# Patient Record
Sex: Female | Born: 1980 | Race: White | Hispanic: No | Marital: Married | Smoking: Never smoker
Health system: Southern US, Community
[De-identification: ages and names within clinical notes are randomized; demographics above are authoritative.]

## PROBLEM LIST (undated history)

## (undated) DIAGNOSIS — T7840XA Allergy, unspecified, initial encounter: Secondary | ICD-10-CM

## (undated) HISTORY — DX: Allergy, unspecified, initial encounter: T78.40XA

---

## 2009-07-19 ENCOUNTER — Encounter (INDEPENDENT_AMBULATORY_CARE_PROVIDER_SITE_OTHER): Payer: Self-pay | Admitting: Obstetrics and Gynecology

## 2009-07-19 ENCOUNTER — Inpatient Hospital Stay (HOSPITAL_COMMUNITY): Admission: RE | Admit: 2009-07-19 | Discharge: 2009-07-22 | Payer: Self-pay | Admitting: Obstetrics and Gynecology

## 2011-03-01 LAB — CBC
HCT: 37.4 % (ref 36.0–46.0)
Hemoglobin: 10.7 g/dL — ABNORMAL LOW (ref 12.0–15.0)
Hemoglobin: 12.7 g/dL (ref 12.0–15.0)
MCHC: 34.2 g/dL (ref 30.0–36.0)
MCV: 93.6 fL (ref 78.0–100.0)
RBC: 3.34 MIL/uL — ABNORMAL LOW (ref 3.87–5.11)
WBC: 11.4 10*3/uL — ABNORMAL HIGH (ref 4.0–10.5)

## 2011-04-08 NOTE — Op Note (Signed)
NAME:  Alexandra Ochoa, PINCOCK NO.:  [id number]   MEDICAL RECORD NO.:  [id number]          PATIENT TYPE:  INP   LOCATION:  9105                          FACILITY:  [facility]   PHYSICIAN:  Maxie Better, M.D.DATE OF BIRTH:  1981/03/29   DATE OF PROCEDURE:  07/19/2009  DATE OF DISCHARGE:                               OPERATIVE REPORT   PREOPERATIVE DIAGNOSES:  1. Term gestation.  2. Breech presentation.  3. Small for gestational age baby.   POSTOPERATIVE DIAGNOSES:  1. Incomplete breech presentation.  2. Term gestation.   PROCEDURE:  Primary cesarean section  Kerr hysterotomy.   ANESTHESIA:  Spinal.   SURGEON:  Maxie Better, MD   ASSISTANT:  Marlinda Mike, CNM   INDICATION:  A 30 year old gravida 1, para 0 female at term with a small  for gestational age baby with a breech presentation admitted for primary  cesarean section.  Surgical risk was reviewed with the patient and her  husband.  Consent was signed.  The patient was transferred to the  operating room after reconfirming breech presentation.   PROCEDURE:  Under adequate spinal anesthesia, the patient was placed in  a supine position with a left lateral tilt.  She was sterilely prepped  and draped in usual fashion.  Indwelling Foley catheter was sterilely  placed.  A 0.25% Marcaine was injected along the planned Pfannenstiel  skin incision site.  Pfannenstiel skin incision was then made carried  down to the rectus fascia.  Rectus fascia was opened transversely.  Rectus fascia was then bluntly and sharply dissected off the rectus  muscle in superior and inferior fashion.  The rectus muscle was split in  midline.  The parietal peritoneum was entered sharply and extended.  The  vesicouterine peritoneum was opened transversely.  The bladder was  bluntly dissected off the lower uterine segment and displaced with a  bladder retractor.  A curvilinear low transverse uterine incision was  then made and extended  with bandage scissors.  Artificial rupture of  membranes occurred, clear fluid noted.  The baby was in the, facing  maternal left, incomplete breech.  The baby was then delivered as a live  female with breech maneuvers.  There was no cord noted.  The cord was  clamped and cut.  The baby was bulb suctioned to the abdomen and  transferred to the awaiting pediatrician who assigned Apgars of 8 and 9  at 1 and 5 minutes.  The placenta was spontaneous and intact.  Uterine  cavity was cleaned of debris, palpated, and inspected.  No evidence of  any intrauterine anomaly noted.  Normal tubes and ovaries were noted  bilaterally.  The abdomen was then copiously irrigated and suctioned of  debris.  The uterine incision was closed in 2 layers, the first layer  with 0 Monocryl running locked stitch, second layer is imbricating using  0 Monocryl suture.  Good hemostasis noted.  The parietal peritoneum was  then closed with 2-0 Vicryl.  The rectus fascia was closed with 0 Vicryl  x2.  The subcutaneous area was irrigated and small bleeders cauterized.  Interrupted 2-0 plain  sutures placed in the skin and approximated using  Ethicon staples.   SPECIMENS:  Placenta was sent to pathology.   ESTIMATED BLOOD LOSS:  600 mL.   INTRAOPERATIVE FLUID:  2100 mL.   URINE OUTPUT:  100 mL, clear yellow urine.   COUNTS:  Sponge and instrument counts x2 was correct.   COMPLICATIONS:  None.   The weight of baby was 6 pounds 7 ounces.  The patient tolerated the  procedure well, was transferred to recovery in stable condition.      Maxie Better, M.D.  Electronically Signed     [place]/MEDQ  D:  07/19/2009  T:  07/20/2009  Job:  [id number]

## 2013-08-04 ENCOUNTER — Ambulatory Visit (INDEPENDENT_AMBULATORY_CARE_PROVIDER_SITE_OTHER): Payer: BC Managed Care – PPO | Admitting: Physician Assistant

## 2013-08-04 VITALS — BP 121/81 | HR 71 | Temp 98.3°F | Resp 18 | Ht 60.25 in | Wt 111.0 lb

## 2013-08-04 DIAGNOSIS — L255 Unspecified contact dermatitis due to plants, except food: Secondary | ICD-10-CM

## 2013-08-04 DIAGNOSIS — L237 Allergic contact dermatitis due to plants, except food: Secondary | ICD-10-CM

## 2013-08-04 MED ORDER — CLOBETASOL PROPIONATE 0.05 % EX CREA: TOPICAL_CREAM | Freq: Two times a day (BID) | CUTANEOUS | Status: DC

## 2013-08-04 MED ORDER — PREDNISONE 20 MG PO TABS: ORAL_TABLET | ORAL | Status: DC

## 2013-08-04 NOTE — Progress Notes (Signed)
  Subjective:    Patient ID: Alexandra Ochoa, female    DOB: 1980/12/19, 32 y.o.   MRN: [id number]  HPI 32 year old female presents with 1 week history of pruritic rash on right posterior thigh and bilateral ankles.  States symptoms appeared after exposure to poison ivy.  Does have hx of allergy to poison ivy and has tried to avoid it.  She has been using OTC creams that do not seem to be helping.  No fever, chills, nausea, vomiting, drainage, or abdominal pain.  Patient is otherwise healthy with no other concerns today.      Review of Systems  Constitutional: Negative for fever and chills.  Gastrointestinal: Negative for nausea and vomiting.  Skin: Positive for rash.  Neurological: Negative for headaches.       Objective:   Physical Exam  Constitutional: She is oriented to person, place, and time. She appears well-developed and well-nourished.  HENT:  Head: Normocephalic and atraumatic.  Right Ear: External ear normal.  Left Ear: External ear normal.  Eyes: Conjunctivae are normal.  Neck: Normal range of motion.  Cardiovascular: Normal rate.   Pulmonary/Chest: Effort normal.  Neurological: She is alert and oriented to person, place, and time.  Skin:     All noted areas have erythematous vesicular eruption in clusters consistent with poison ivy. No drainage, induration, or tenderness  Psychiatric: She has a normal mood and affect. Her behavior is normal. Judgment and thought content normal.          Assessment & Plan:  Poison ivy - Plan: clobetasol cream (TEMOVATE) 0.05 %, predniSONE (DELTASONE) 20 MG tablet  Patient Instructions  Zyrtec daily in the morning Benadryl 25-50 mg at bedtime Clobetasol cream 2-3 times daily Start prednisone taper tomorrow Let us know if symptoms fail to improve after treatment completed

## 2013-08-04 NOTE — Patient Instructions (Addendum)
Zyrtec daily in the morning Benadryl 25-50 mg at bedtime Clobetasol cream 2-3 times daily Start prednisone taper tomorrow Let us know if symptoms fail to improve after treatment completed

## 2013-09-02 ENCOUNTER — Other Ambulatory Visit: Payer: Self-pay | Admitting: Physician Assistant

## 2013-09-09 ENCOUNTER — Ambulatory Visit: Payer: BC Managed Care – PPO

## 2013-09-09 ENCOUNTER — Ambulatory Visit (INDEPENDENT_AMBULATORY_CARE_PROVIDER_SITE_OTHER): Payer: BC Managed Care – PPO | Admitting: Family Medicine

## 2013-09-09 VITALS — BP 110/60 | HR 125 | Temp 99.5°F | Resp 16 | Ht 60.0 in | Wt 111.0 lb

## 2013-09-09 DIAGNOSIS — R05 Cough: Secondary | ICD-10-CM

## 2013-09-09 DIAGNOSIS — D72829 Elevated white blood cell count, unspecified: Secondary | ICD-10-CM

## 2013-09-09 DIAGNOSIS — R059 Cough, unspecified: Secondary | ICD-10-CM

## 2013-09-09 DIAGNOSIS — J329 Chronic sinusitis, unspecified: Secondary | ICD-10-CM

## 2013-09-09 DIAGNOSIS — R51 Headache: Secondary | ICD-10-CM

## 2013-09-09 DIAGNOSIS — R000 Tachycardia, unspecified: Secondary | ICD-10-CM

## 2013-09-09 LAB — POCT CBC
Granulocyte percent: 85.6 %G — AB (ref 37–80)
HCT, POC: 40.3 % (ref 37.7–47.9)
Hemoglobin: 12.9 g/dL (ref 12.2–16.2)
Lymph, poc: 2 (ref 0.6–3.4)
MCH, POC: 28.9 pg (ref 27–31.2)
MCHC: 32 g/dL (ref 31.8–35.4)
MCV: 90.1 fL (ref 80–97)
MID (cbc): 0.9 (ref 0–0.9)
MPV: 10 fL (ref 0–99.8)
POC Granulocyte: 17.1 — AB (ref 2–6.9)
POC LYMPH PERCENT: 9.8 %L — AB (ref 10–50)
POC MID %: 4.6 %M (ref 0–12)
Platelet Count, POC: 219 10*3/uL (ref 142–424)
RBC: 4.47 M/uL (ref 4.04–5.48)
RDW, POC: 14.9 %
WBC: 20 10*3/uL — AB (ref 4.6–10.2)

## 2013-09-09 LAB — D-DIMER, QUANTITATIVE: D-Dimer, Quant: 0.42 ug/mL-FEU (ref 0.00–0.48)

## 2013-09-09 MED ORDER — HYDROCOD POLST-CHLORPHEN POLST 10-8 MG/5ML PO LQCR: 5.0000 mL | Freq: Two times a day (BID) | ORAL | Status: DC | PRN

## 2013-09-09 MED ORDER — ALBUTEROL SULFATE HFA 108 (90 BASE) MCG/ACT IN AERS: 2.0000 | INHALATION_SPRAY | RESPIRATORY_TRACT | Status: AC | PRN

## 2013-09-09 MED ORDER — LEVOFLOXACIN 500 MG PO TABS: 500.0000 mg | ORAL_TABLET | Freq: Every day | ORAL | Status: DC

## 2013-09-09 MED ORDER — ALBUTEROL SULFATE (2.5 MG/3ML) 0.083% IN NEBU: 2.5000 mg | INHALATION_SOLUTION | Freq: Once | RESPIRATORY_TRACT | Status: AC

## 2013-09-09 NOTE — Progress Notes (Signed)
Subjective:    Patient ID: Alexandra Ochoa, female    DOB: 1981/06/22, 32 y.o.   MRN: [id number]  Sinus Problem Associated symptoms include chills, congestion, coughing, headaches, shortness of breath and sinus pressure. Pertinent negatives include no ear pain or sore throat.   32 year old female presents for evaluation of 2 weeks of nasal congestion, sinus pressure, PND, and productive cough.  States symptoms started as a cold but have progressively worsened.  She has now developed a persistent, hacking cough that is worse when she lays down at night.  Complains of SOB and some subjective wheezing.  Complains of sinus pain and pressure.  Has had chills today and subjective fever.  No hx of asthma. She is a nonsmoker.   Patient is otherwise doing well with no other concerns today.  Works as a [employer] scheme manager.      Review of Systems  Constitutional: Positive for chills. Negative for fever.  HENT: Positive for congestion, postnasal drip and sinus pressure. Negative for ear pain and sore throat.   Respiratory: Positive for cough, shortness of breath and wheezing (subjective). Negative for chest tightness.   Gastrointestinal: Negative for nausea and vomiting.  Neurological: Positive for headaches. Negative for dizziness.       Objective:   Physical Exam  Constitutional: She is oriented to person, place, and time. She appears well-developed and well-nourished.  HENT:  Head: Normocephalic and atraumatic.  Right Ear: Hearing, tympanic membrane, external ear and ear canal normal.  Left Ear: Hearing, tympanic membrane, external ear and ear canal normal.  Mouth/Throat: Uvula is midline, oropharynx is clear and moist and mucous membranes are normal. No posterior oropharyngeal erythema (clear postnasal drainage).  Eyes: Conjunctivae are normal.  Neck: Normal range of motion. Neck supple.  Cardiovascular: Normal rate, regular rhythm and normal heart sounds.   Pulmonary/Chest: Effort normal and  breath sounds normal.  Deep inspiration triggers cough  Lymphadenopathy:    She has no cervical adenopathy.  Neurological: She is alert and oriented to person, place, and time.  Psychiatric: She has a normal mood and affect. Her behavior is normal. Judgment and thought content normal.   Results for orders placed in visit on 09/09/13  POCT CBC      Result Value Range   WBC 20.0 (*) 4.6 - 10.2 K/uL   Lymph, poc 2.0  0.6 - 3.4   POC LYMPH PERCENT 9.8 (*) 10 - 50 %L   MID (cbc) 0.9  0 - 0.9   POC MID % 4.6  0 - 12 %M   POC Granulocyte 17.1 (*) 2 - 6.9   Granulocyte percent 85.6 (*) 37 - 80 %G   RBC 4.47  4.04 - 5.48 M/uL   Hemoglobin 12.9  12.2 - 16.2 g/dL   HCT, POC 08.6  57.8 - 47.9 %   MCV 90.1  80 - 97 fL   MCH, POC 28.9  27 - 31.2 pg   MCHC 32.0  31.8 - 35.4 g/dL   RDW, POC 46.9     Platelet Count, POC 219  142 - 424 K/uL   MPV 10.0  0 - 99.8 fL     spO2 s/p nebulizer 98%, HR 125 Patient reports improvement in symptoms s/p albuterol nebulizer. [facility] reading (PRIMARY) by  Dr. Patsy Lager as no acute infiltrate or consolidation      Assessment & Plan:   Cough - Plan: albuterol (PROVENTIL) (2.5 MG/3ML) 0.083% nebulizer solution 2.5 mg, POCT CBC  Sinusitis  Headache(784.0)  Likely sinus infection causing leukocytosis but will cover for pneumonia with Levaquin 500 mg daily x 7 days Due to tachycardia and hypoxia at triage, will obtain d-dimer to rule out PE. Discussed possible need for CT if elevated - patient understands and agrees.  Albuterol q4hours prn wheezing, SOB Tussionex qhs for cough - caution sedation If not significantly improved in 48 hours, return for recheck.

## 2013-09-10 NOTE — Progress Notes (Signed)
Received her OR report 09/10/13:  CHEST 2 VIEW  COMPARISON: None.  FINDINGS:  Consolidation is noted in the left lower lobe partly silhouetting  the left hemidiaphragm consistent with pneumonia.  The lungs are otherwise clear. No pleural effusion or pneumothorax.  The heart, mediastinum and hila are unremarkable.  IMPRESSION:  Left lower lobe pneumonia.   Called to check on her- she feels a bit better.  Went over x-ray report.   She will come back for a recheck CBC lab visit only in 2 or 3 weeks.   She will let us know if not continuing to get better

## 2013-09-17 ENCOUNTER — Telehealth: Payer: Self-pay

## 2013-09-17 DIAGNOSIS — R05 Cough: Secondary | ICD-10-CM

## 2013-09-17 NOTE — Telephone Encounter (Signed)
Patient wants refill of hydrocodone (tussionex) sent to [facility] on [address]

## 2013-09-19 MED ORDER — HYDROCOD POLST-CHLORPHEN POLST 10-8 MG/5ML PO LQCR: 5.0000 mL | Freq: Two times a day (BID) | ORAL | Status: DC | PRN

## 2013-09-19 NOTE — Telephone Encounter (Signed)
Called her, advised ready for pick up

## 2013-09-19 NOTE — Telephone Encounter (Signed)
Rx printed and signed at TL desk.  I think pt will need to pick up rx due to increased schedule of hydrocodone now

## 2014-02-09 ENCOUNTER — Ambulatory Visit (INDEPENDENT_AMBULATORY_CARE_PROVIDER_SITE_OTHER): Payer: BC Managed Care – PPO | Admitting: Emergency Medicine

## 2014-02-09 VITALS — BP 100/60 | HR 70 | Temp 97.8°F | Resp 16 | Ht 59.75 in | Wt 108.0 lb

## 2014-02-09 DIAGNOSIS — J309 Allergic rhinitis, unspecified: Secondary | ICD-10-CM

## 2014-02-09 MED ORDER — IPRATROPIUM BROMIDE 0.03 % NA SOLN: 2.0000 | Freq: Two times a day (BID) | NASAL | Status: DC

## 2014-02-09 MED ORDER — FLUTICASONE PROPIONATE 50 MCG/ACT NA SUSP: 2.0000 | Freq: Every day | NASAL | Status: DC

## 2014-02-09 MED ORDER — PSEUDOEPHEDRINE-GUAIFENESIN ER 60-600 MG PO TB12: 1.0000 | ORAL_TABLET | Freq: Two times a day (BID) | ORAL | Status: AC

## 2014-02-09 MED ORDER — PSEUDOEPHEDRINE-GUAIFENESIN ER 60-600 MG PO TB12
1.0000 | ORAL_TABLET | Freq: Two times a day (BID) | ORAL | Status: DC
Start: 2014-02-09 — End: 2014-02-09

## 2014-02-09 NOTE — Patient Instructions (Signed)
Allergic Rhinitis Allergic rhinitis is when the mucous membranes in the nose respond to allergens. Allergens are particles in the air that cause your body to have an allergic reaction. This causes you to release allergic antibodies. Through a chain of events, these eventually cause you to release histamine into the blood stream. Although meant to protect the body, it is this release of histamine that causes your discomfort, such as frequent sneezing, congestion, and an itchy, runny nose.  CAUSES  Seasonal allergic rhinitis (hay fever) is caused by pollen allergens that may come from grasses, trees, and weeds. Year-round allergic rhinitis (perennial allergic rhinitis) is caused by allergens such as house dust mites, pet dander, and mold spores.  SYMPTOMS   Nasal stuffiness (congestion).  Itchy, runny nose with sneezing and tearing of the eyes. DIAGNOSIS  Your health care provider can help you determine the allergen or allergens that trigger your symptoms. If you and your health care provider are unable to determine the allergen, skin or blood testing may be used. TREATMENT  Allergic Rhinitis does not have a cure, but it can be controlled by:  Medicines and allergy shots (immunotherapy).  Avoiding the allergen. Hay fever may often be treated with antihistamines in pill or nasal spray forms. Antihistamines block the effects of histamine. There are over-the-counter medicines that may help with nasal congestion and swelling around the eyes. Check with your health care provider before taking or giving this medicine.  If avoiding the allergen or the medicine prescribed do not work, there are many new medicines your health care provider can prescribe. Stronger medicine may be used if initial measures are ineffective. Desensitizing injections can be used if medicine and avoidance does not work. Desensitization is when a patient is given ongoing shots until the body becomes less sensitive to the allergen.  Make sure you follow up with your health care provider if problems continue. HOME CARE INSTRUCTIONS It is not possible to completely avoid allergens, but you can reduce your symptoms by taking steps to limit your exposure to them. It helps to know exactly what you are allergic to so that you can avoid your specific triggers. SEEK MEDICAL CARE IF:   You have a fever.  You develop a cough that does not stop easily (persistent).  You have shortness of breath.  You start wheezing.  Symptoms interfere with normal daily activities. Document Released: 08/05/2001 Document Revised: 08/31/2013 Document Reviewed: 07/18/2013 [employer] Patient Information 2014 [employer].  

## 2014-02-09 NOTE — Progress Notes (Addendum)
[facility] [address], [place] [address] [phone]  Date:  02/09/2014   Name:  Alexandra Ochoa   DOB:  [birth year]   MRN:  [id number]  PCP:  No primary provider on file.    Chief Complaint: Nasal Congestion, Cough and nasal drainage   History of Present Illness:  Alexandra Ochoa is a 33 y.o. very pleasant female patient who presents with the following:  Sudden onset of nasal congestion and watery nasal drainage. She has used a number of OTC products and cannot breath through her nose.  She has no facial pain.  Has a mucopurulent sputum No eye symptoms.  No fever or chills.  No nausea or vomiting.  No wheezing or shortness of breath.  Occasional nighttime cough.  History of what sounds like seasonal allergic rhinitis that has progressed to sinus infection and on one occasion a pneumonia.  No improvement with over the counter medications or other home remedies. Denies other complaint or health concern today.   There are no active problems to display for this patient.   Past Medical History  Diagnosis Date  . Allergy     Past Surgical History  Procedure Laterality Date  . Cesarean section      History  Substance Use Topics  . Smoking status: Never Smoker   . Smokeless tobacco: Not on file  . Alcohol Use: 0.0 oz/week    1-2 Glasses of wine per week    Family History  Problem Relation Age of Onset  . Cancer Mother   . Cancer Father     No Known Allergies  Medication list has been reviewed and updated.  Current Outpatient Prescriptions on File Prior to Visit  Medication Sig Dispense Refill  . albuterol (PROVENTIL HFA;VENTOLIN HFA) 108 (90 BASE) MCG/ACT inhaler Inhale 2 puffs into the lungs every 4 (four) hours as needed for wheezing (cough, shortness of breath or wheezing.).  1 Inhaler  0  . chlorpheniramine-HYDROcodone (TUSSIONEX PENNKINETIC ER) 10-8 MG/5ML LQCR Take 5 mLs by mouth every 12 (twelve) hours as needed (cough).  60 mL  0  .  levofloxacin (LEVAQUIN) 500 MG tablet Take 1 tablet (500 mg total) by mouth daily.  7 tablet  0   Current Facility-Administered Medications on File Prior to Visit  Medication Dose Route Frequency Provider Last Rate Last Dose  . albuterol (PROVENTIL) (2.5 MG/3ML) 0.083% nebulizer solution 2.5 mg  2.5 mg Nebulization Once Nelva Nay, PA-C        Review of Systems:  As per HPI, otherwise negative.    Physical Examination: Filed Vitals:   02/09/14 2003  BP: 100/60  Pulse: 70  Temp: 97.8 F (36.6 C)  Resp: 16   Filed Vitals:   02/09/14 2003  Height: 4' 11.75" (1.518 m)  Weight: 108 lb (48.988 kg)   Body mass index is 21.26 kg/(m^2). Ideal Body Weight: Weight in (lb) to have BMI = 25: 126.7  GEN: WDWN, NAD, Non-toxic, A & O x 3 HEENT: Atraumatic, Normocephalic. Neck supple. No masses, No LAD. Ears and Nose: No external deformity.  Watery nasal drainage with sever congestion and pallid swollen mucosa CV: RRR, No M/G/R. No JVD. No thrill. No extra heart sounds. PULM: CTA B, no wheezes, crackles, rhonchi. No retractions. No resp. distress. No accessory muscle use. ABD: S, NT, ND, +BS. No rebound. No HSM. EXTR: No c/c/e NEURO Normal gait.  PSYCH: Normally interactive. Conversant. Not depressed or anxious appearing.  Calm demeanor.  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  Signed,  Phillips OdorJeffery Shiraz Bastyr, MD

## 2016-03-28 ENCOUNTER — Ambulatory Visit (INDEPENDENT_AMBULATORY_CARE_PROVIDER_SITE_OTHER): Payer: BLUE CROSS/BLUE SHIELD | Admitting: Family Medicine

## 2016-03-28 VITALS — BP 102/60 | HR 80 | Temp 98.8°F | Resp 17 | Ht 60.0 in | Wt 115.0 lb

## 2016-03-28 DIAGNOSIS — M6248 Contracture of muscle, other site: Secondary | ICD-10-CM

## 2016-03-28 DIAGNOSIS — M62838 Other muscle spasm: Secondary | ICD-10-CM

## 2016-03-28 DIAGNOSIS — J039 Acute tonsillitis, unspecified: Secondary | ICD-10-CM

## 2016-03-28 MED ORDER — CYCLOBENZAPRINE HCL 5 MG PO TABS: 5.0000 mg | ORAL_TABLET | Freq: Every evening | ORAL | Status: AC | PRN

## 2016-03-28 MED ORDER — CEFDINIR 300 MG PO CAPS: 300.0000 mg | ORAL_CAPSULE | Freq: Two times a day (BID) | ORAL | Status: AC

## 2016-03-28 MED ORDER — FLUTICASONE PROPIONATE 50 MCG/ACT NA SUSP: 2.0000 | Freq: Every day | NASAL | Status: DC

## 2016-03-28 NOTE — Progress Notes (Signed)
Alexandra PalmerLauren C Ochoa is a 35 y.o. female who presents to [facility]gent Care today for sore throat. Patient notes a three-day history of mild sore throat cough and congestion worsening today becoming severe. She notes pain with swallowing. Additionally she notes posting of drainage cough and congestion.  Additionally she notes bilateral trapezius soreness worse with activity. She denies any radiating pain weakness or numbness. She has tried Aleve and Flonase nasal spray which do help. No vomiting or diarrhea. No trouble breathing chest pain or palpitations.   Past Medical History  Diagnosis Date  . Allergy    Past Surgical History  Procedure Laterality Date  . Cesarean section     Social History  Substance Use Topics  . Smoking status: Never Smoker   . Smokeless tobacco: Not on file  . Alcohol Use: 0.0 oz/week    1-2 Glasses of wine per week   ROS as above Medications: Current Outpatient Prescriptions  Medication Sig Dispense Refill  . albuterol (PROVENTIL HFA;VENTOLIN HFA) 108 (90 BASE) MCG/ACT inhaler Inhale 2 puffs into the lungs every 4 (four) hours as needed for wheezing (cough, shortness of breath or wheezing.). 1 Inhaler 0  . fluticasone (FLONASE) 50 MCG/ACT nasal spray Place 2 sprays into both nostrils daily. 16 g 12  . cefdinir (OMNICEF) 300 MG capsule Take 1 capsule (300 mg total) by mouth 2 (two) times daily. 14 capsule 0  . cyclobenzaprine (FLEXERIL) 5 MG tablet Take 1-2 tablets (5-10 mg total) by mouth at bedtime as needed for muscle spasms. 30 tablet 0   Current Facility-Administered Medications  Medication Dose Route Frequency Provider Last Rate Last Dose  . albuterol (PROVENTIL) (2.5 MG/3ML) 0.083% nebulizer solution 2.5 mg  2.5 mg Nebulization Once Heather M Marte, PA-C       No Known Allergies   Exam:  BP 102/60 mmHg  Pulse 80  Temp(Src) 98.8 F (37.1 C) (Oral)  Resp 17  Ht 5' (1.524 m)  Wt 115 lb (52.164 kg)  BMI 22.46 kg/m2  SpO2 99%  LMP 03/26/2016 Gen: Well  NAD HEENT: EOMI,  MMM Tonsil hypertrophy erythema with exudates bilaterally. Posterior pharynx with cobblestoning. Swollen and tender lymph nodes bilateral cervical lymph chain. Lungs: Normal work of breathing. CTABL Heart: RRR no MRG Abd: NABS, Soft. Nondistended, Nontender Exts: Brisk capillary refill, warm and well perfused.  MSK: Trapezius is are tender to palpation bilaterally. Pain with shoulder shrug. Normal neck motion.  No results found for this or any previous visit (from the past 24 hour(s)). No results found.  Assessment and Plan: 35 y.o. female with  Tonsillitis: Treat with Omnicef and naproxen. Trapezius spasm: Treat with Flexeril and naproxen and heating pad. Postnasal drainage: Continue Flonase nasal spray.  Discussed warning signs or symptoms. Please see discharge instructions. Patient expresses understanding.

## 2016-03-28 NOTE — Patient Instructions (Addendum)
Thank you for coming in today. Continue the aleve.  Start omnicef twice daily for tonsillitis.  Use flexeril at night for muscle spasm.  Call or go to the emergency room if you get worse, have trouble breathing, have chest pains, or palpitations.  Use flonase nasal spray.  Use a heating pad.   Tonsillitis Tonsillitis is an infection of the throat that causes the tonsils to become red, tender, and swollen. Tonsils are collections of lymphoid tissue at the back of the throat. Each tonsil has crevices (crypts). Tonsils help fight nose and throat infections and keep infection from spreading to other parts of the body for the first 18 months of life.  CAUSES Sudden (acute) tonsillitis is usually caused by infection with streptococcal bacteria. Long-lasting (chronic) tonsillitis occurs when the crypts of the tonsils become filled with pieces of food and bacteria, which makes it easy for the tonsils to become repeatedly infected. SYMPTOMS  Symptoms of tonsillitis include:  A sore throat, with possible difficulty swallowing.  White patches on the tonsils.  Fever.  Tiredness.  New episodes of snoring during sleep, when you did not snore before.  Small, foul-smelling, yellowish-white pieces of material (tonsilloliths) that you occasionally cough up or spit out. The tonsilloliths can also cause you to have bad breath. DIAGNOSIS Tonsillitis can be diagnosed through a physical exam. Diagnosis can be confirmed with the results of lab tests, including a throat culture. TREATMENT  The goals of tonsillitis treatment include the reduction of the severity and duration of symptoms and prevention of associated conditions. Symptoms of tonsillitis can be improved with the use of steroids to reduce the swelling. Tonsillitis caused by bacteria can be treated with antibiotic medicines. Usually, treatment with antibiotic medicines is started before the cause of the tonsillitis is known. However, if it is  determined that the cause is not bacterial, antibiotic medicines will not treat the tonsillitis. If attacks of tonsillitis are severe and frequent, your health care provider may recommend surgery to remove the tonsils (tonsillectomy). HOME CARE INSTRUCTIONS   Rest as much as possible and get plenty of sleep.  Drink plenty of fluids. While the throat is very sore, eat soft foods or liquids, such as sherbet, soups, or instant breakfast drinks.  Eat frozen ice pops.  Gargle with a warm or cold liquid to help soothe the throat. Mix 1/4 teaspoon of salt and 1/4 teaspoon of baking soda in 8 oz of water. SEEK MEDICAL CARE IF:   Large, tender lumps develop in your neck.  A rash develops.  A green, yellow-brown, or bloody substance is coughed up.  You are unable to swallow liquids or food for 24 hours.  You notice that only one of the tonsils is swollen. SEEK IMMEDIATE MEDICAL CARE IF:   You develop any new symptoms such as vomiting, severe headache, stiff neck, chest pain, or trouble breathing or swallowing.  You have severe throat pain along with drooling or voice changes.  You have severe pain, unrelieved with recommended medications.  You are unable to fully open the mouth.  You develop redness, swelling, or severe pain anywhere in the neck.  You have a fever. MAKE SURE YOU:   Understand these instructions.  Will watch your condition.  Will get help right away if you are not doing well or get worse.   This information is not intended to replace advice given to you by your health care provider. Make sure you discuss any questions you have with your health care provider.  Document Released: 08/20/2005 Document Revised: 12/01/2014 Document Reviewed: 04/29/2013 Elsevier Interactive Patient Education 2016 [employer].    IF you received an x-ray today, you will receive an invoice from [facility]. Please contact [facility] at [phone] with questions or  concerns regarding your invoice.   IF you received labwork today, you will receive an invoice from [employer]. Please contact [facility] at [phone] with questions or concerns regarding your invoice.   Our billing staff will not be able to assist you with questions regarding bills from these companies.  You will be contacted with the lab results as soon as they are available. The fastest way to get your results is to activate your My Chart account. Instructions are located on the last page of this paperwork. If you have not heard from [place] regarding the results in 2 weeks, please contact this office.

## 2017-05-29 ENCOUNTER — Other Ambulatory Visit: Payer: Self-pay | Admitting: Family Medicine

## 2017-08-06 ENCOUNTER — Emergency Department (HOSPITAL_COMMUNITY): Payer: No Typology Code available for payment source

## 2017-08-06 ENCOUNTER — Encounter (HOSPITAL_COMMUNITY): Payer: Self-pay | Admitting: Emergency Medicine

## 2017-08-06 ENCOUNTER — Emergency Department (HOSPITAL_COMMUNITY)
Admission: EM | Admit: 2017-08-06 | Discharge: 2017-08-06 | Disposition: A | Discharge status: Home / Self Care | Payer: No Typology Code available for payment source | Attending: Emergency Medicine | Admitting: Emergency Medicine

## 2017-08-06 DIAGNOSIS — M25532 Pain in left wrist: Secondary | ICD-10-CM | POA: Insufficient documentation

## 2017-08-06 DIAGNOSIS — R51 Headache: Secondary | ICD-10-CM | POA: Insufficient documentation

## 2017-08-06 DIAGNOSIS — Z79899 Other long term (current) drug therapy: Secondary | ICD-10-CM | POA: Diagnosis not present

## 2017-08-06 DIAGNOSIS — Z043 Encounter for examination and observation following other accident: Secondary | ICD-10-CM | POA: Insufficient documentation

## 2017-08-06 DIAGNOSIS — M25522 Pain in left elbow: Secondary | ICD-10-CM | POA: Insufficient documentation

## 2017-08-06 MED ORDER — NAPROXEN 375 MG PO TABS: 375.0000 mg | ORAL_TABLET | Freq: Two times a day (BID) | ORAL | 0 refills | Status: AC
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
## 2017-08-06 NOTE — ED Notes (Signed)
Bed: [facility] Expected date:  Expected time:  Means of arrival:  Comments: 

## 2017-08-06 NOTE — Discharge Instructions (Signed)
Expect to be stiff and sore for the next week or so, take the medications as needed for pain, return as needed for worsening symptoms
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
## 2017-08-06 NOTE — ED Notes (Signed)
Bed: [facility] Expected date:  Expected time:  Means of arrival:  Comments: 

## 2019-02-22 ENCOUNTER — Other Ambulatory Visit: Payer: Self-pay | Admitting: Family Medicine
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
## 2021-11-21 ENCOUNTER — Telehealth: Payer: Self-pay | Admitting: Genetic Counselor

## 2021-11-21 NOTE — Telephone Encounter (Signed)
Scheduled appt per 12/22 referral. Pt is aware of appt date and time. Pt is also aware to arrive 15 mins prior to appt time.

## 2022-01-13 ENCOUNTER — Telehealth: Payer: Self-pay | Admitting: Genetic Counselor

## 2022-01-13 ENCOUNTER — Inpatient Hospital Stay: Payer: Self-pay

## 2022-01-13 ENCOUNTER — Inpatient Hospital Stay: Payer: Self-pay | Attending: Genetic Counselor | Admitting: Genetic Counselor
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
## 2022-01-14 ENCOUNTER — Telehealth: Payer: Self-pay | Admitting: Genetic Counselor
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
